# Patient Record
Sex: Female | Born: 1950 | Race: Black or African American | Hispanic: No | Marital: Married | Smoking: Never smoker
Health system: Southern US, Community
[De-identification: ages and names within clinical notes are randomized; demographics above are authoritative.]

## PROBLEM LIST (undated history)

## (undated) DIAGNOSIS — R001 Bradycardia, unspecified: Secondary | ICD-10-CM

## (undated) DIAGNOSIS — Z972 Presence of dental prosthetic device (complete) (partial): Secondary | ICD-10-CM

## (undated) DIAGNOSIS — I38 Endocarditis, valve unspecified: Secondary | ICD-10-CM

## (undated) DIAGNOSIS — E785 Hyperlipidemia, unspecified: Secondary | ICD-10-CM

## (undated) DIAGNOSIS — I10 Essential (primary) hypertension: Secondary | ICD-10-CM

## (undated) HISTORY — PX: TUBAL LIGATION: SHX77

---

## 2004-11-19 ENCOUNTER — Ambulatory Visit: Payer: Self-pay | Admitting: Internal Medicine

## 2004-12-17 ENCOUNTER — Ambulatory Visit: Payer: Self-pay | Admitting: Occupational Therapy

## 2006-01-01 ENCOUNTER — Ambulatory Visit: Payer: Self-pay | Admitting: Nurse Practitioner

## 2007-01-28 ENCOUNTER — Ambulatory Visit: Payer: Self-pay | Admitting: Internal Medicine

## 2008-02-07 ENCOUNTER — Ambulatory Visit: Payer: Self-pay | Admitting: Internal Medicine

## 2008-04-07 ENCOUNTER — Ambulatory Visit: Payer: Self-pay | Admitting: Gastroenterology

## 2009-02-08 ENCOUNTER — Ambulatory Visit: Payer: Self-pay | Admitting: Internal Medicine

## 2010-02-11 ENCOUNTER — Ambulatory Visit: Payer: Self-pay | Admitting: Internal Medicine

## 2011-04-21 ENCOUNTER — Ambulatory Visit: Payer: Self-pay | Admitting: Family Medicine

## 2011-09-19 ENCOUNTER — Ambulatory Visit: Payer: Self-pay | Admitting: Gastroenterology

## 2011-09-22 LAB — PATHOLOGY REPORT

## 2012-05-25 ENCOUNTER — Ambulatory Visit: Payer: Self-pay | Admitting: Internal Medicine

## 2013-09-29 ENCOUNTER — Ambulatory Visit: Payer: Self-pay | Admitting: Family Medicine

## 2014-11-08 ENCOUNTER — Ambulatory Visit: Payer: Self-pay | Admitting: Family Medicine

## 2015-03-05 ENCOUNTER — Ambulatory Visit
Admit: 2015-03-05 | Disposition: A | Discharge status: Home / Self Care | Payer: Self-pay | Attending: Gastroenterology | Admitting: Gastroenterology

## 2015-03-06 LAB — SURGICAL PATHOLOGY

## 2017-01-05 ENCOUNTER — Other Ambulatory Visit: Payer: Self-pay | Admitting: Physician Assistant

## 2017-01-05 DIAGNOSIS — Z1231 Encounter for screening mammogram for malignant neoplasm of breast: Secondary | ICD-10-CM

## 2017-02-09 ENCOUNTER — Encounter: Payer: Self-pay | Admitting: Radiology

## 2017-02-09 ENCOUNTER — Ambulatory Visit
Admission: RE | Admit: 2017-02-09 | Discharge: 2017-02-09 | Disposition: A | Discharge status: Home / Self Care | Payer: BLUE CROSS/BLUE SHIELD | Source: Ambulatory Visit | Attending: Physician Assistant | Admitting: Physician Assistant

## 2017-02-09 DIAGNOSIS — Z1231 Encounter for screening mammogram for malignant neoplasm of breast: Secondary | ICD-10-CM | POA: Diagnosis present

## 2018-01-27 ENCOUNTER — Other Ambulatory Visit: Payer: Self-pay | Admitting: Physician Assistant

## 2018-01-27 DIAGNOSIS — Z780 Asymptomatic menopausal state: Secondary | ICD-10-CM

## 2018-03-01 ENCOUNTER — Other Ambulatory Visit: Payer: BLUE CROSS/BLUE SHIELD

## 2018-03-22 ENCOUNTER — Other Ambulatory Visit: Payer: Self-pay | Admitting: Physician Assistant

## 2018-03-22 DIAGNOSIS — Z1231 Encounter for screening mammogram for malignant neoplasm of breast: Secondary | ICD-10-CM

## 2018-03-23 ENCOUNTER — Other Ambulatory Visit: Payer: Self-pay

## 2018-03-29 ENCOUNTER — Ambulatory Visit
Admission: RE | Admit: 2018-03-29 | Discharge: 2018-03-29 | Disposition: A | Discharge status: Home / Self Care | Payer: Medicare HMO | Source: Ambulatory Visit | Attending: Physician Assistant | Admitting: Physician Assistant

## 2018-03-29 DIAGNOSIS — Z1231 Encounter for screening mammogram for malignant neoplasm of breast: Secondary | ICD-10-CM | POA: Insufficient documentation

## 2018-03-29 DIAGNOSIS — Z780 Asymptomatic menopausal state: Secondary | ICD-10-CM | POA: Insufficient documentation

## 2018-09-10 ENCOUNTER — Encounter: Payer: Self-pay | Admitting: *Deleted

## 2018-09-13 ENCOUNTER — Encounter
Admission: RE | Disposition: A | Discharge status: Home / Self Care | Payer: Self-pay | Source: Ambulatory Visit | Attending: Gastroenterology

## 2018-09-13 ENCOUNTER — Encounter: Payer: Self-pay | Admitting: *Deleted

## 2018-09-13 ENCOUNTER — Ambulatory Visit: Payer: Medicare HMO | Admitting: Anesthesiology

## 2018-09-13 ENCOUNTER — Ambulatory Visit
Admission: RE | Admit: 2018-09-13 | Discharge: 2018-09-13 | Disposition: A | Discharge status: Home / Self Care | Payer: Medicare HMO | Source: Ambulatory Visit | Attending: Gastroenterology | Admitting: Gastroenterology

## 2018-09-13 DIAGNOSIS — Z79899 Other long term (current) drug therapy: Secondary | ICD-10-CM | POA: Diagnosis not present

## 2018-09-13 DIAGNOSIS — E785 Hyperlipidemia, unspecified: Secondary | ICD-10-CM | POA: Insufficient documentation

## 2018-09-13 DIAGNOSIS — D122 Benign neoplasm of ascending colon: Secondary | ICD-10-CM | POA: Diagnosis not present

## 2018-09-13 DIAGNOSIS — K5730 Diverticulosis of large intestine without perforation or abscess without bleeding: Secondary | ICD-10-CM | POA: Insufficient documentation

## 2018-09-13 DIAGNOSIS — I10 Essential (primary) hypertension: Secondary | ICD-10-CM | POA: Insufficient documentation

## 2018-09-13 DIAGNOSIS — Z86010 Personal history of colonic polyps: Secondary | ICD-10-CM | POA: Insufficient documentation

## 2018-09-13 DIAGNOSIS — K621 Rectal polyp: Secondary | ICD-10-CM | POA: Diagnosis not present

## 2018-09-13 DIAGNOSIS — D123 Benign neoplasm of transverse colon: Secondary | ICD-10-CM | POA: Diagnosis not present

## 2018-09-13 DIAGNOSIS — Z8371 Family history of colonic polyps: Secondary | ICD-10-CM | POA: Diagnosis not present

## 2018-09-13 HISTORY — DX: Essential (primary) hypertension: I10

## 2018-09-13 HISTORY — PX: COLONOSCOPY WITH PROPOFOL: SHX5780

## 2018-09-13 HISTORY — DX: Bradycardia, unspecified: R00.1

## 2018-09-13 HISTORY — DX: Hyperlipidemia, unspecified: E78.5

## 2018-09-13 HISTORY — DX: Endocarditis, valve unspecified: I38

## 2018-09-13 SURGERY — COLONOSCOPY WITH PROPOFOL
Anesthesia: General

## 2018-09-13 MED ORDER — FENTANYL CITRATE (PF) 100 MCG/2ML IJ SOLN
INTRAMUSCULAR | Status: AC
  Filled 2018-09-13: qty 2

## 2018-09-13 MED ORDER — MIDAZOLAM HCL 2 MG/2ML IJ SOLN
INTRAMUSCULAR | Status: AC
  Filled 2018-09-13: qty 2

## 2018-09-13 MED ORDER — MIDAZOLAM HCL 2 MG/2ML IJ SOLN
INTRAMUSCULAR | Status: DC | PRN
  Administered 2018-09-13: 2 mg via INTRAVENOUS

## 2018-09-13 MED ORDER — GLYCOPYRROLATE 0.2 MG/ML IJ SOLN
INTRAMUSCULAR | Status: AC
  Filled 2018-09-13: qty 1

## 2018-09-13 MED ORDER — GLYCOPYRROLATE 0.2 MG/ML IJ SOLN
INTRAMUSCULAR | Status: DC | PRN
  Administered 2018-09-13: 0.1 mg via INTRAVENOUS

## 2018-09-13 MED ORDER — FENTANYL CITRATE (PF) 100 MCG/2ML IJ SOLN
INTRAMUSCULAR | Status: DC | PRN
  Administered 2018-09-13: 50 ug via INTRAVENOUS

## 2018-09-13 MED ORDER — PROPOFOL 500 MG/50ML IV EMUL
INTRAVENOUS | Status: DC | PRN
  Administered 2018-09-13: 120 ug/kg/min via INTRAVENOUS

## 2018-09-13 MED ORDER — SODIUM CHLORIDE 0.9 % IV SOLN
INTRAVENOUS | Status: DC
  Administered 2018-09-13 (×2): via INTRAVENOUS

## 2018-09-13 MED ORDER — PROPOFOL 10 MG/ML IV BOLUS
INTRAVENOUS | Status: AC
  Filled 2018-09-13: qty 20

## 2018-09-13 MED ORDER — PROPOFOL 500 MG/50ML IV EMUL
INTRAVENOUS | Status: AC
  Filled 2018-09-13: qty 50

## 2018-09-13 MED ORDER — SODIUM CHLORIDE 0.9 % IV SOLN
INTRAVENOUS | Status: DC
  Administered 2018-09-13: 1000 mL via INTRAVENOUS

## 2018-09-13 MED ORDER — LIDOCAINE HCL (PF) 1 % IJ SOLN: 2.0000 mL | Freq: Once | INTRAMUSCULAR | Status: DC

## 2018-09-13 MED ORDER — EPHEDRINE SULFATE 50 MG/ML IJ SOLN
INTRAMUSCULAR | Status: AC
  Filled 2018-09-13: qty 1

## 2018-09-13 NOTE — H&P (Signed)
Outpatient short stay form Pre-procedure 09/13/2018 12:11 PM Lollie Sails MD  Primary Physician: Delfina Redwood, PA  Reason for visit: Colonoscopy  History of present illness: Patient is a 67 year old female presenting today as above.  She has personal history of adenomatous colon polyps.  Her last colonoscopy was 03/05/2015.  At that time of her 5 adenomas removed.  She also has a family history of colon polyps in a primary relative, sister.  She tolerated her prep well.  She takes no aspirin or blood thinning agent.    Current Facility-Administered Medications:  .  0.9 %  sodium chloride infusion, , Intravenous, Continuous, Lollie Sails, MD .  0.9 %  sodium chloride infusion, , Intravenous, Continuous, Lollie Sails, MD, Last Rate: 20 mL/hr at 09/13/18 1208, 1,000 mL at 09/13/18 1208 .  lidocaine (PF) (XYLOCAINE) 1 % injection 2 mL, 2 mL, Intradermal, Once, Lollie Sails, MD  Medications Prior to Admission  Medication Sig Dispense Refill Last Dose  . calcium carbonate (OSCAL) 1500 (600 Ca) MG TABS tablet Take by mouth 2 (two) times daily with a meal.     . cyanocobalamin 1000 MCG tablet Take 1,000 mcg by mouth daily.     . fluticasone (FLONASE) 50 MCG/ACT nasal spray Place 2 sprays into both nostrils daily.     Marland Kitchen loratadine (CLARITIN) 10 MG tablet Take 10 mg by mouth daily.        No Known Allergies   Past Medical History:  Diagnosis Date  . Bradycardia   . Hyperlipidemia   . Hypertension   . Valvular heart disease     Review of systems:      Physical Exam    Heart and lungs: Without rub or gallop, lungs are bilaterally clear.    HEENT: Normocephalic atraumatic eyes are anicteric    Other:    Pertinant exam for procedure: Soft nontender nondistended bowel sounds positive normoactive.    Planned proceedures: Colonoscopy and indicated procedures I have discussed the risks benefits and complications of procedures to include not limited to bleeding,  infection, perforation and the risk of sedation and the patient wishes to proceed.    Lollie Sails, MD Gastroenterology 09/13/2018  12:11 PM

## 2018-09-13 NOTE — Op Note (Signed)
[facility] Gastroenterology Patient Name: Brittany George Procedure Date: 09/13/2018 12:18 PM MRN: [id number] Account #: [id number] Date of Birth: 07/21/1951 Admit Type: Outpatient Age: 67 Room: [facility] ENDO ROOM 3 Gender: Female Note Status: Finalized Procedure:            Colonoscopy Indications:          Personal history of colonic polyps Providers:            Lollie Sails, MD Referring MD:         No Local Md, MD (Referring MD) Medicines:            Monitored Anesthesia Care Complications:        No immediate complications. Procedure:            Pre-Anesthesia Assessment:                       - ASA Grade Assessment: III - A patient with severe                        systemic disease.                       After obtaining informed consent, the colonoscope was                        passed under direct vision. Throughout the procedure,                        the patient's blood pressure, pulse, and oxygen                        saturations were monitored continuously. The                        Colonoscope was introduced through the anus and                        advanced to the the cecum, identified by appendiceal                        orifice and ileocecal valve. The colonoscopy was                        performed without difficulty. The patient tolerated the                        procedure well. The quality of the bowel preparation                        was good. Findings:      Multiple small-mouthed diverticula were found in the sigmoid colon and       descending colon.      A 3 mm polyp was found in the rectum. The polyp was sessile. The polyp       was removed with a cold biopsy forceps. Resection and retrieval were       complete.      Three sessile polyps were found in the transverse colon. The polyps were       2 to 4 mm in size. These polyps were removed with a cold biopsy forceps.  Resection and retrieval were complete.      A 4 mm  polyp was found in the proximal ascending colon. The polyp was       sessile. The polyp was removed with a cold biopsy forceps. Resection and       retrieval were complete.      A 3 mm polyp was found in the distal transverse colon. The polyp was       sessile. The polyp was removed with a cold biopsy forceps. Resection and       retrieval were complete.      A 5 mm polyp was found in the distal transverse colon. The polyp was       sessile. The polyp was removed with a cold snare. Resection and       retrieval were complete.      The retroflexed view of the distal rectum and anal verge was normal and       showed no anal or rectal abnormalities.      The digital rectal exam was normal. Impression:           - Diverticulosis in the sigmoid colon and in the                        descending colon.                       - One 3 mm polyp in the rectum, removed with a cold                        biopsy forceps. Resected and retrieved.                       - Three 2 to 4 mm polyps in the transverse colon,                        removed with a cold biopsy forceps. Resected and                        retrieved.                       - One 4 mm polyp in the proximal ascending colon,                        removed with a cold biopsy forceps. Resected and                        retrieved.                       - One 3 mm polyp in the distal transverse colon,                        removed with a cold biopsy forceps. Resected and                        retrieved.                       - One 5 mm polyp in the distal transverse colon,  removed with a cold snare. Resected and retrieved.                       - The distal rectum and anal verge are normal on                        retroflexion view. Recommendation:       - Discharge patient to home.                       - Await pathology results.                       - Telephone GI clinic for pathology results in 1  week. Procedure Code(s):    --- Professional ---                       [phone], Colonoscopy, flexible; with removal of tumor(s),                        polyp(s), or other lesion(s) by snare technique                       [id number], 76, Colonoscopy, flexible; with biopsy, single                        or multiple Diagnosis Code(s):    --- Professional ---                       K62.1, Rectal polyp                       D12.2, Benign neoplasm of ascending colon                       D12.3, Benign neoplasm of transverse colon (hepatic                        flexure or splenic flexure)                       Z86.010, Personal history of colonic polyps                       K57.30, Diverticulosis of large intestine without                        perforation or abscess without bleeding CPT copyright 2018 American Medical Association. All rights reserved. The codes documented in this report are preliminary and upon coder review may  be revised to meet current compliance requirements. Lollie Sails, MD 09/13/2018 1:02:12 PM This report has been signed electronically. Number of Addenda: 0 Note Initiated On: 09/13/2018 12:18 PM Scope Withdrawal Time: 0 hours 10 minutes 28 seconds  Total Procedure Duration: 0 hours 34 minutes 32 seconds       [facility]

## 2018-09-13 NOTE — Transfer of Care (Signed)
Immediate Anesthesia Transfer of Care Note  Patient: Brittany George  Procedure(s) Performed: COLONOSCOPY WITH PROPOFOL (N/A )  Patient Location: PACU  Anesthesia Type:General  Level of Consciousness: awake and sedated  Airway & Oxygen Therapy: Patient Spontanous Breathing and Patient connected to nasal cannula oxygen  Post-op Assessment: Report given to RN and Post -op Vital signs reviewed and stable  Post vital signs: Reviewed and stable  Last Vitals:  Vitals Value Taken Time  BP    Temp    Pulse    Resp    SpO2      Last Pain:  Vitals:   09/13/18 1148  TempSrc: Tympanic  PainSc: 0-No pain         Complications: No apparent anesthesia complications

## 2018-09-13 NOTE — Anesthesia Preprocedure Evaluation (Signed)
Anesthesia Evaluation  Patient identified by MRN, date of birth, ID band Patient awake    Reviewed: Allergy & Precautions, H&P , NPO status , Patient's Chart, lab work & pertinent test results  History of Anesthesia Complications Negative for: history of anesthetic complications  Airway Mallampati: III  TM Distance: <3 FB Neck ROM: full    Dental  (+) Chipped, Poor Dentition, Missing   Pulmonary neg pulmonary ROS, neg shortness of breath,           Cardiovascular Exercise Tolerance: Good hypertension, (-) angina(-) Past MI and (-) DOE      Neuro/Psych negative neurological ROS  negative psych ROS   GI/Hepatic negative GI ROS, Neg liver ROS, neg GERD  ,  Endo/Other    Renal/GU negative Renal ROS  negative genitourinary   Musculoskeletal   Abdominal   Peds  Hematology negative hematology ROS (+)   Anesthesia Other Findings Past Medical History: No date: Bradycardia No date: Hyperlipidemia No date: Hypertension No date: Valvular heart disease  Past Surgical History: No date: TUBAL LIGATION     Reproductive/Obstetrics negative OB ROS                             Anesthesia Physical Anesthesia Plan  ASA: III  Anesthesia Plan: General   Post-op Pain Management:    Induction: Intravenous  PONV Risk Score and Plan: Propofol infusion and TIVA  Airway Management Planned: Natural Airway and Nasal Cannula  Additional Equipment:   Intra-op Plan:   Post-operative Plan:   Informed Consent: I have reviewed the patients History and Physical, chart, labs and discussed the procedure including the risks, benefits and alternatives for the proposed anesthesia with the patient or authorized representative who has indicated his/her understanding and acceptance.   Dental Advisory Given  Plan Discussed with: Anesthesiologist, CRNA and Surgeon  Anesthesia Plan Comments: (Patient consented  for risks of anesthesia including but not limited to:  - adverse reactions to medications - risk of intubation if required - damage to teeth, lips or other oral mucosa - sore throat or hoarseness - Damage to heart, brain, lungs or loss of life  Patient voiced understanding.)        Anesthesia Quick Evaluation

## 2018-09-13 NOTE — Anesthesia Procedure Notes (Signed)
Performed by: Cook-Martin, Meridith Romick Pre-anesthesia Checklist: Patient identified, Emergency Drugs available, Suction available, Patient being monitored and Timeout performed Patient Re-evaluated:Patient Re-evaluated prior to induction Oxygen Delivery Method: Nasal cannula Preoxygenation: Pre-oxygenation with 100% oxygen Induction Type: IV induction Placement Confirmation: positive ETCO2 and CO2 detector       

## 2018-09-13 NOTE — Anesthesia Post-op Follow-up Note (Signed)
Anesthesia QCDR form completed.        

## 2018-09-13 NOTE — Anesthesia Postprocedure Evaluation (Signed)
Anesthesia Post Note  Patient: Brittany George  Procedure(s) Performed: COLONOSCOPY WITH PROPOFOL (N/A )  Patient location during evaluation: Endoscopy Anesthesia Type: General Level of consciousness: awake and alert Pain management: pain level controlled Vital Signs Assessment: post-procedure vital signs reviewed and stable Respiratory status: spontaneous breathing, nonlabored ventilation, respiratory function stable and patient connected to nasal cannula oxygen Cardiovascular status: blood pressure returned to baseline and stable Postop Assessment: no apparent nausea or vomiting Anesthetic complications: no     Last Vitals:  Vitals:   09/13/18 1303 09/13/18 1323  BP: 114/72   Pulse: 77   Resp:  18  Temp: (!) 36.1 C   SpO2:      Last Pain:  Vitals:   09/13/18 1323  TempSrc:   PainSc: 0-No pain                 Precious Haws Favor Hackler

## 2018-09-14 ENCOUNTER — Encounter: Payer: Self-pay | Admitting: Gastroenterology

## 2018-09-15 LAB — SURGICAL PATHOLOGY

## 2019-03-17 ENCOUNTER — Other Ambulatory Visit: Payer: Self-pay | Admitting: Physician Assistant

## 2019-03-17 DIAGNOSIS — Z1231 Encounter for screening mammogram for malignant neoplasm of breast: Secondary | ICD-10-CM

## 2019-06-03 ENCOUNTER — Ambulatory Visit
Admission: RE | Admit: 2019-06-03 | Discharge: 2019-06-03 | Disposition: A | Discharge status: Home / Self Care | Payer: Medicare HMO | Source: Ambulatory Visit | Attending: Physician Assistant | Admitting: Physician Assistant

## 2019-06-03 DIAGNOSIS — Z1231 Encounter for screening mammogram for malignant neoplasm of breast: Secondary | ICD-10-CM | POA: Diagnosis not present

## 2020-01-27 ENCOUNTER — Other Ambulatory Visit (HOSPITAL_COMMUNITY): Payer: Self-pay | Admitting: Family Medicine

## 2020-01-27 DIAGNOSIS — M8580 Other specified disorders of bone density and structure, unspecified site: Secondary | ICD-10-CM

## 2020-04-02 ENCOUNTER — Ambulatory Visit (HOSPITAL_COMMUNITY)
Admission: RE | Admit: 2020-04-02 | Discharge: 2020-04-02 | Disposition: A | Discharge status: Home / Self Care | Payer: Medicare HMO | Source: Ambulatory Visit | Attending: Family Medicine | Admitting: Family Medicine

## 2020-04-02 ENCOUNTER — Other Ambulatory Visit: Payer: Self-pay

## 2020-04-02 DIAGNOSIS — M8580 Other specified disorders of bone density and structure, unspecified site: Secondary | ICD-10-CM

## 2020-04-02 DIAGNOSIS — M8588 Other specified disorders of bone density and structure, other site: Secondary | ICD-10-CM | POA: Diagnosis not present

## 2020-04-24 ENCOUNTER — Other Ambulatory Visit: Payer: Self-pay | Admitting: Family Medicine

## 2020-04-24 DIAGNOSIS — Z1231 Encounter for screening mammogram for malignant neoplasm of breast: Secondary | ICD-10-CM

## 2020-06-04 ENCOUNTER — Ambulatory Visit
Admission: RE | Admit: 2020-06-04 | Discharge: 2020-06-04 | Disposition: A | Discharge status: Home / Self Care | Payer: Medicare HMO | Source: Ambulatory Visit | Attending: Family Medicine | Admitting: Family Medicine

## 2020-06-04 DIAGNOSIS — Z1231 Encounter for screening mammogram for malignant neoplasm of breast: Secondary | ICD-10-CM | POA: Insufficient documentation

## 2020-07-31 ENCOUNTER — Ambulatory Visit: Payer: Medicare HMO | Admitting: Dermatology

## 2020-07-31 ENCOUNTER — Other Ambulatory Visit: Payer: Self-pay

## 2020-07-31 DIAGNOSIS — D229 Melanocytic nevi, unspecified: Secondary | ICD-10-CM

## 2020-07-31 DIAGNOSIS — D220 Melanocytic nevi of lip: Secondary | ICD-10-CM

## 2020-07-31 DIAGNOSIS — L304 Erythema intertrigo: Secondary | ICD-10-CM

## 2020-07-31 MED ORDER — HYDROCORTISONE 2.5 % EX CREA: TOPICAL_CREAM | CUTANEOUS | 2 refills | Status: DC

## 2020-07-31 MED ORDER — KETOCONAZOLE 2 % EX CREA: TOPICAL_CREAM | CUTANEOUS | 2 refills | Status: DC

## 2020-07-31 NOTE — Progress Notes (Signed)
   Follow-Up Visit   Subjective  Brittany George is a 69 y.o. female who presents for the following: Follow-up.  Patient here today for follow up. She was seen over a year ago so we have no record of her last visit. We saw her for a spot at her top lip that dentist recommend she have evaluated. As far as patient can tell there has been no change.  She was also seen for a rash around the neck that we gave her a prescription cream for but unsure of name. She does have some bumps now inframammary area she would like checked. They do itch.   The following portions of the chart were reviewed this encounter and updated as appropriate:      Review of Systems:  No other skin or systemic complaints except as noted in HPI or Assessment and Plan.  Objective  Well appearing patient in no apparent distress; mood and affect are within normal limits.  A focused examination was performed including face, chest, neck. Relevant physical exam findings are noted in the Assessment and Plan.  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  Stable. Observation.  Call clinic for new or changing moles.     Erythema intertrigo inframammary  Resolving with PIH, with continued pruritus  Recommend Zeasorb AF powder Start hydrocortisone 2.5% cream 1-2 times daily as needed for itch. Start ketoconazole 2% cream daily to areas under breast.  Ordered Medications: ketoconazole (NIZORAL) 2 % cream hydrocortisone 2.5 % cream  Return in about 8 months (around 04/15/2021).  Graciella Belton, RMA, am acting as scribe for Brendolyn Patty, MD . Documentation: I have reviewed the above documentation for accuracy and completeness, and I agree with the above.  Brendolyn Patty MD

## 2020-07-31 NOTE — Patient Instructions (Addendum)
Recommend Zeasorb AF powder to areas for rash under breast. Start hydrocortisone 2.5% cream 1-2 times daily as needed for itch under breast. Start ketoconazole 2% cream daily to areas under breast as needed for rash.

## 2020-08-01 ENCOUNTER — Other Ambulatory Visit: Payer: Self-pay

## 2020-08-01 DIAGNOSIS — L304 Erythema intertrigo: Secondary | ICD-10-CM

## 2020-08-01 MED ORDER — KETOCONAZOLE 2 % EX CREA: TOPICAL_CREAM | CUTANEOUS | 2 refills | Status: DC

## 2020-08-01 MED ORDER — HYDROCORTISONE 2.5 % EX CREA: TOPICAL_CREAM | CUTANEOUS | 2 refills | Status: DC

## 2020-08-02 ENCOUNTER — Other Ambulatory Visit: Payer: Self-pay

## 2020-08-02 DIAGNOSIS — L304 Erythema intertrigo: Secondary | ICD-10-CM

## 2020-08-06 MED ORDER — HYDROCORTISONE 2.5 % EX CREA: TOPICAL_CREAM | CUTANEOUS | 2 refills | Status: DC

## 2020-08-06 MED ORDER — KETOCONAZOLE 2 % EX CREA: TOPICAL_CREAM | CUTANEOUS | 2 refills | Status: DC

## 2020-08-06 MED ORDER — KETOCONAZOLE 2 % EX CREA: TOPICAL_CREAM | CUTANEOUS | 2 refills | Status: AC

## 2020-08-06 MED ORDER — HYDROCORTISONE 2.5 % EX CREA: TOPICAL_CREAM | CUTANEOUS | 2 refills | Status: AC

## 2020-08-06 NOTE — Progress Notes (Signed)
Per pt, pharmacy did not receive original prescriptions.

## 2020-08-06 NOTE — Addendum Note (Signed)
Addended by: Harriett Sine on: 08/06/2020 12:50 PM   Modules accepted: Orders

## 2021-03-04 ENCOUNTER — Other Ambulatory Visit: Payer: Self-pay | Admitting: Family Medicine

## 2021-03-04 DIAGNOSIS — Z1231 Encounter for screening mammogram for malignant neoplasm of breast: Secondary | ICD-10-CM

## 2021-03-08 ENCOUNTER — Other Ambulatory Visit: Payer: Self-pay | Admitting: Family Medicine

## 2021-03-08 DIAGNOSIS — Z1231 Encounter for screening mammogram for malignant neoplasm of breast: Secondary | ICD-10-CM

## 2021-03-25 ENCOUNTER — Other Ambulatory Visit: Payer: Self-pay | Admitting: Family Medicine

## 2021-03-25 DIAGNOSIS — M8580 Other specified disorders of bone density and structure, unspecified site: Secondary | ICD-10-CM

## 2021-04-16 ENCOUNTER — Other Ambulatory Visit: Payer: Self-pay

## 2021-04-16 ENCOUNTER — Ambulatory Visit: Payer: Medicare HMO | Admitting: Dermatology

## 2021-04-16 DIAGNOSIS — D229 Melanocytic nevi, unspecified: Secondary | ICD-10-CM

## 2021-04-16 DIAGNOSIS — S20462A Insect bite (nonvenomous) of left back wall of thorax, initial encounter: Secondary | ICD-10-CM

## 2021-04-16 DIAGNOSIS — L304 Erythema intertrigo: Secondary | ICD-10-CM

## 2021-04-16 DIAGNOSIS — S20469A Insect bite (nonvenomous) of unspecified back wall of thorax, initial encounter: Secondary | ICD-10-CM

## 2021-04-16 DIAGNOSIS — D220 Melanocytic nevi of lip: Secondary | ICD-10-CM

## 2021-04-16 DIAGNOSIS — W57XXXA Bitten or stung by nonvenomous insect and other nonvenomous arthropods, initial encounter: Secondary | ICD-10-CM

## 2021-04-16 NOTE — Patient Instructions (Signed)
Zeasorb Af powder - over the counter - can be used daily as needed to areas under breast for rash   Halog ointment - apply tiny amount to affected area at left upper back twice daily as needed for itch.        If you have any questions or concerns for your doctor, please call our main line at [phone] and press option 4 to reach your doctor's medical assistant. If no one answers, please leave a voicemail as directed and we will return your call as soon as possible. Messages left after 4 pm will be answered the following business day.   You may also send [place] a message via [place]. We typically respond to MyChart messages within 1-2 business days.  For prescription refills, please ask your pharmacy to contact our office. Our fax number is [phone].  If you have an urgent issue when the clinic is closed that cannot wait until the next business day, you can page your doctor at the number below.    Please note that while we do our best to be available for urgent issues outside of office hours, we are not available 24/7.   If you have an urgent issue and are unable to reach [place], you may choose to seek medical care at your doctor's office, retail clinic, urgent care center, or emergency room.  If you have a medical emergency, please immediately call 911 or go to the emergency department.  Pager Numbers  - Dr. Nehemiah Massed: [phone]  - Dr. Laurence Ferrari: [phone]  - Dr. Nicole Kindred: [phone]  In the event of inclement weather, please call our main line at [phone] for an update on the status of any delays or closures.  Dermatology Medication Tips: Please keep the boxes that topical medications come in in order to help keep track of the instructions about where and how to use these. Pharmacies typically print the medication instructions only on the boxes and not directly on the medication tubes.   If your medication is too expensive, please contact our office at [phone] option 4  or send [place] a message through [place].   We are unable to tell what your co-pay for medications will be in advance as this is different depending on your insurance coverage. However, we may be able to find a substitute medication at lower cost or fill out paperwork to get insurance to cover a needed medication.   If a prior authorization is required to get your medication covered by your insurance company, please allow [place] 1-2 business days to complete this process.  Drug prices often vary depending on where the prescription is filled and some pharmacies may offer cheaper prices.  The website www.goodrx.com contains coupons for medications through different pharmacies. The prices here do not account for what the cost may be with help from insurance (it may be cheaper with your insurance), but the website can give you the price if you did not use any insurance.  - You can print the associated coupon and take it with your prescription to the pharmacy.  - You may also stop by our office during regular business hours and pick up a GoodRx coupon card.  - If you need your prescription sent electronically to a different pharmacy, notify our office through [facility] or by phone at [phone] option 4.

## 2021-04-16 NOTE — Progress Notes (Signed)
   Follow-Up Visit   Subjective  Brittany George is a 70 y.o. female who presents for the following: 8 month follow up (Patient here today for follow up on nevus at upper lip and intertrigo under breast. She reports rash under breast is doing better. She is still continuing to use ketoconazle 2 % cream and hydrocortisone 2.5. She also states she was bit by a tick a week ago and would like area on left side back checked today. She states her niece removed a piece of tick. Since then area is still itchy. ).  The following portions of the chart were reviewed this encounter and updated as appropriate:       Objective  Well appearing patient in no apparent distress; mood and affect are within normal limits.  A focused examination was performed including lip, left upper back, inframammary folds . Relevant physical exam findings are noted in the Assessment and Plan.  Objective  Right Inframammary Fold: Hyperpigmentation at BL inframammary fold, no active rash today   Objective  Mucosal  Upper Lip: 1.59mm dark gray blue papule, No change when compared to photo  Objective  Left Upper Back: Pink brown erythematous papule at left upper back- itchy per pt  Assessment & Plan  Erythema intertrigo Right Inframammary Fold  Controlled   Intertrigo is a chronic recurrent rash that occurs in skin fold areas that may be associated with friction; heat; moisture; yeast; fungus; and bacteria.  It is exacerbated by increased movement / activity; sweating; and higher atmospheric temperature.  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  Stable. Observation.  Call clinic for  new or changing moles.    Arthropod bite of back, initial encounter Left Upper Back  Patient states tick removed from area a week ago.  Start Halog ointment - apply tiny amount to affect area of left upper back twice daily as needed for itch   Sample given for Halog ointment exp 12/2021  lot # [id number]     Return in about 1 year (around 04/16/2022) for nevus, intertrigo follow up.   I, Ruthell Rummage, CMA, am acting as scribe for Brendolyn Patty, MD.  Documentation: I have reviewed the above documentation for accuracy and completeness, and I agree with the above.  Brendolyn Patty MD

## 2021-06-05 ENCOUNTER — Ambulatory Visit
Admission: RE | Admit: 2021-06-05 | Discharge: 2021-06-05 | Disposition: A | Discharge status: Home / Self Care | Payer: Medicare HMO | Source: Ambulatory Visit | Attending: Family Medicine | Admitting: Family Medicine

## 2021-06-05 ENCOUNTER — Other Ambulatory Visit: Payer: Self-pay

## 2021-06-05 DIAGNOSIS — Z1231 Encounter for screening mammogram for malignant neoplasm of breast: Secondary | ICD-10-CM | POA: Diagnosis not present

## 2022-01-27 ENCOUNTER — Ambulatory Visit: Payer: Medicare HMO | Admitting: Anesthesiology

## 2022-01-27 ENCOUNTER — Ambulatory Visit
Admission: RE | Admit: 2022-01-27 | Discharge: 2022-01-27 | Disposition: A | Discharge status: Home / Self Care | Payer: Medicare HMO | Attending: Gastroenterology | Admitting: Gastroenterology

## 2022-01-27 ENCOUNTER — Encounter
Admission: RE | Disposition: A | Discharge status: Home / Self Care | Payer: Self-pay | Source: Home / Self Care | Attending: Gastroenterology

## 2022-01-27 DIAGNOSIS — D120 Benign neoplasm of cecum: Secondary | ICD-10-CM | POA: Diagnosis not present

## 2022-01-27 DIAGNOSIS — E669 Obesity, unspecified: Secondary | ICD-10-CM | POA: Insufficient documentation

## 2022-01-27 DIAGNOSIS — K5730 Diverticulosis of large intestine without perforation or abscess without bleeding: Secondary | ICD-10-CM | POA: Insufficient documentation

## 2022-01-27 DIAGNOSIS — K640 First degree hemorrhoids: Secondary | ICD-10-CM | POA: Insufficient documentation

## 2022-01-27 DIAGNOSIS — E785 Hyperlipidemia, unspecified: Secondary | ICD-10-CM | POA: Diagnosis not present

## 2022-01-27 DIAGNOSIS — D123 Benign neoplasm of transverse colon: Secondary | ICD-10-CM | POA: Insufficient documentation

## 2022-01-27 DIAGNOSIS — I10 Essential (primary) hypertension: Secondary | ICD-10-CM | POA: Insufficient documentation

## 2022-01-27 DIAGNOSIS — Z1211 Encounter for screening for malignant neoplasm of colon: Secondary | ICD-10-CM | POA: Diagnosis present

## 2022-01-27 DIAGNOSIS — Z6837 Body mass index (BMI) 37.0-37.9, adult: Secondary | ICD-10-CM | POA: Insufficient documentation

## 2022-01-27 HISTORY — PX: COLONOSCOPY WITH PROPOFOL: SHX5780

## 2022-01-27 SURGERY — COLONOSCOPY WITH PROPOFOL
Anesthesia: General

## 2022-01-27 MED ORDER — PROPOFOL 500 MG/50ML IV EMUL
INTRAVENOUS | Status: DC | PRN
  Administered 2022-01-27: 160 ug/kg/min via INTRAVENOUS

## 2022-01-27 MED ORDER — SODIUM CHLORIDE 0.9 % IV SOLN
INTRAVENOUS | Status: DC
  Administered 2022-01-27: 20 mL/h via INTRAVENOUS

## 2022-01-27 MED ORDER — PROPOFOL 10 MG/ML IV BOLUS
INTRAVENOUS | Status: DC | PRN
  Administered 2022-01-27: 70 mg via INTRAVENOUS

## 2022-01-27 MED ORDER — PHENYLEPHRINE HCL (PRESSORS) 10 MG/ML IV SOLN
INTRAVENOUS | Status: DC | PRN
Start: 2022-01-27 — End: 2022-01-27
  Administered 2022-01-27 (×3): 40 ug via INTRAVENOUS

## 2022-01-27 MED ORDER — PROPOFOL 500 MG/50ML IV EMUL
INTRAVENOUS | Status: AC
  Filled 2022-01-27: qty 50

## 2022-01-27 NOTE — Anesthesia Procedure Notes (Signed)
Date/Time: 01/27/2022 11:49 AM ?Performed by: Demetrius Charity, CRNA ?Pre-anesthesia Checklist: Patient identified, Emergency Drugs available, Suction available, Patient being monitored and Timeout performed ?Patient Re-evaluated:Patient Re-evaluated prior to induction ?Oxygen Delivery Method: Nasal cannula ?Induction Type: IV induction ?Placement Confirmation: CO2 detector and positive ETCO2 ? ? ? ? ?

## 2022-01-27 NOTE — Anesthesia Preprocedure Evaluation (Signed)
Anesthesia Evaluation  ?Patient identified by MRN, date of birth, ID band ?Patient awake ? ? ? ?Reviewed: ?Allergy & Precautions, NPO status , Patient's Chart, lab work & pertinent test results ? ?History of Anesthesia Complications ?Negative for: history of anesthetic complications ? ?Airway ?Mallampati: III ? ?TM Distance: >3 FB ?Neck ROM: full ? ? ? Dental ? ?(+) Missing ?  ?Pulmonary ?neg pulmonary ROS, neg shortness of breath,  ?  ?Pulmonary exam normal ? ? ? ? ? ? ? Cardiovascular ?Exercise Tolerance: Good ?hypertension, Normal cardiovascular exam ? ? ?  ?Neuro/Psych ?negative neurological ROS ? negative psych ROS  ? GI/Hepatic ?negative GI ROS, Neg liver ROS, neg GERD  ,  ?Endo/Other  ?Morbid obesity ? Renal/GU ?negative Renal ROS  ?negative genitourinary ?  ?Musculoskeletal ? ? Abdominal ?  ?Peds ? Hematology ?negative hematology ROS ?(+)   ?Anesthesia Other Findings ?Past Medical History: ?No date: Bradycardia ?No date: Hyperlipidemia ?No date: Hypertension ?No date: Valvular heart disease ? ?Past Surgical History: ?09/13/2018: COLONOSCOPY WITH PROPOFOL; N/A ?    Comment:  Procedure: COLONOSCOPY WITH PROPOFOL;  Surgeon:  ?             Lollie Sails, MD;  Location: [facility] ENDOSCOPY;   ?             Service: Endoscopy;  Laterality: N/A; ?No date: TUBAL LIGATION ? ?BMI   ? Body Mass Index: 37.43 kg/m?  ?  ? ? Reproductive/Obstetrics ?negative OB ROS ? ?  ? ? ? ? ? ? ? ? ? ? ? ? ? ?  ?  ? ? ? ? ? ? ? ? ?Anesthesia Physical ?Anesthesia Plan ? ?ASA: 3 ? ?Anesthesia Plan: General  ? ?Post-op Pain Management:   ? ?Induction: Intravenous ? ?PONV Risk Score and Plan: Propofol infusion and TIVA ? ?Airway Management Planned: Natural Airway and Nasal Cannula ? ?Additional Equipment:  ? ?Intra-op Plan:  ? ?Post-operative Plan:  ? ?Informed Consent: I have reviewed the patients History and Physical, chart, labs and discussed the procedure including the risks, benefits and alternatives  for the proposed anesthesia with the patient or authorized representative who has indicated his/her understanding and acceptance.  ? ? ? ?Dental Advisory Given ? ?Plan Discussed with: Anesthesiologist, CRNA and Surgeon ? ?Anesthesia Plan Comments: (Patient consented for risks of anesthesia including but not limited to:  ?- adverse reactions to medications ?- risk of airway placement if required ?- damage to eyes, teeth, lips or other oral mucosa ?- nerve damage due to positioning  ?- sore throat or hoarseness ?- Damage to heart, brain, nerves, lungs, other parts of body or loss of life ? ?Patient voiced understanding.)  ? ? ? ? ? ? ?Anesthesia Quick Evaluation ? ?

## 2022-01-27 NOTE — Transfer of Care (Signed)
Immediate Anesthesia Transfer of Care Note ? ?Patient: Brittany George ? ?Procedure(s) Performed: COLONOSCOPY WITH PROPOFOL ? ?Patient Location: PACU ? ?Anesthesia Type:General ? ?Level of Consciousness: drowsy ? ?Airway & Oxygen Therapy: Patient Spontanous Breathing and Patient connected to nasal cannula oxygen ? ?Post-op Assessment: Report given to RN and Post -op Vital signs reviewed and stable ? ?Post vital signs: Reviewed and stable ? ?Last Vitals:  ?Vitals Value Taken Time  ?BP    ?Temp    ?Pulse    ?Resp    ?SpO2    ? ? ?Last Pain:  ?Vitals:  ? 01/27/22 1104  ?TempSrc: Temporal  ?PainSc: 0-No pain  ?   ? ?  ? ?Complications: No notable events documented. ?

## 2022-01-27 NOTE — Anesthesia Postprocedure Evaluation (Signed)
Anesthesia Post Note ? ?Patient: Brittany George ? ?Procedure(s) Performed: COLONOSCOPY WITH PROPOFOL ? ?Patient location during evaluation: Endoscopy ?Anesthesia Type: General ?Level of consciousness: awake and alert ?Pain management: pain level controlled ?Vital Signs Assessment: post-procedure vital signs reviewed and stable ?Respiratory status: spontaneous breathing, nonlabored ventilation, respiratory function stable and patient connected to nasal cannula oxygen ?Cardiovascular status: blood pressure returned to baseline and stable ?Postop Assessment: no apparent nausea or vomiting ?Anesthetic complications: no ? ? ?No notable events documented. ? ? ?Last Vitals:  ?Vitals:  ? 01/27/22 1229 01/27/22 1239  ?BP: 135/67 125/81  ?Pulse: (!) 52 (!) 45  ?Resp: 19 13  ?Temp:    ?SpO2: 100% 100%  ?  ?Last Pain:  ?Vitals:  ? 01/27/22 1239  ?TempSrc:   ?PainSc: 0-No pain  ? ? ?  ?  ?  ?  ?  ?  ? ?Brittany George ? ? ? ? ?

## 2022-01-27 NOTE — Interval H&P Note (Signed)
History and Physical Interval Note: ? ?01/27/2022 ?11:45 AM ? ?Brittany George  has presented today for surgery, with the diagnosis of PH Colonic Polyps.  The various methods of treatment have been discussed with the patient and family. After consideration of risks, benefits and other options for treatment, the patient has consented to  Procedure(s): ?COLONOSCOPY WITH PROPOFOL (N/A) as a surgical intervention.  The patient's history has been reviewed, patient examined, no change in status, stable for surgery.  I have reviewed the patient's chart and labs.  Questions were answered to the patient's satisfaction.   ? ? ?Hilton Cork [name] ? ?Ok to proceed with colonoscopy ?

## 2022-01-27 NOTE — H&P (Signed)
Outpatient short stay form Pre-procedure ?01/27/2022  ?Lesly Rubenstein, MD ? ?Primary Physician: [facility] [employer] ? ?Reason for visit:  Surveillance Colonoscopy ? ?History of present illness:   ? ?71 y/o lady with history of multiple polyps on last colonoscopy in 2019 here for surveillance colonoscopy. No blood thinners. No family history of GI malignancies. No significant abdominal surgeries. History of obesity and HLD. ? ? ? ?Current Facility-Administered Medications:  ?  0.9 %  sodium chloride infusion, , Intravenous, Continuous, Miakoda Mcmillion, Hilton Cork, MD, Last Rate: 20 mL/hr at 01/27/22 1133, Continued from Pre-op at 01/27/22 1133 ? ?Medications Prior to Admission  ?Medication Sig Dispense Refill Last Dose  ? fluticasone (FLONASE) 50 MCG/ACT nasal spray Place 2 sprays into both nostrils daily.   Past Week  ? hydrocortisone 2.5 % cream Apply as needed for itch 1-2 times daily to area under breast 30 g 2 Past Week  ? ketoconazole (NIZORAL) 2 % cream Apply to areas under breast as needed for rash 60 g 2 Past Week  ? loratadine (CLARITIN) 10 MG tablet Take 10 mg by mouth daily.   Past Week  ? mometasone (ELOCON) 0.1 % cream SMARTSIG:Sparingly Topical Twice Daily   Past Week  ? pravastatin (PRAVACHOL) 40 MG tablet Take 40 mg by mouth daily.   Past Week  ? ? ? ?No Known Allergies ? ? ?Past Medical History:  ?Diagnosis Date  ? Bradycardia   ? Hyperlipidemia   ? Hypertension   ? Valvular heart disease   ? ? ?Review of systems:  Otherwise negative.  ? ? ?Physical Exam ? ?Gen: Alert, oriented. Appears stated age.  ?HEENT: PERRLA. ?Lungs: No respiratory distress ?CV: RRR ?Abd: soft, benign, no masses ?Ext: No edema ? ? ? ?Planned procedures: Proceed with colonoscopy. The patient understands the nature of the planned procedure, indications, risks, alternatives and potential complications including but not limited to bleeding, infection, perforation, damage to internal organs and possible oversedation/side  effects from anesthesia. The patient agrees and gives consent to proceed.  ?Please refer to procedure notes for findings, recommendations and patient disposition/instructions.  ? ? ? ?Lesly Rubenstein, MD ?[facility] ? ? ? ?  ? ?

## 2022-01-27 NOTE — Op Note (Signed)
[facility] ?Gastroenterology ?Patient Name: Brittany George ?Procedure Date: 01/27/2022 11:45 AM ?MRN: [id number] ?Account #: [id number] ?Date of Birth: Nov 07, 1951 ?Admit Type: Outpatient ?Age: 71 ?Room: [facility] ENDO ROOM 3 ?Gender: Female ?Note Status: Finalized ?Instrument Name: Colonoscope [id number] ?Procedure:             Colonoscopy ?Indications:           Surveillance: Personal history of adenomatous polyps  ?                       on last colonoscopy > 3 years ago ?Providers:             Andrey Farmer MD, MD ?Medicines:             Monitored Anesthesia Care ?Complications:         No immediate complications. Estimated blood loss:  ?                       Minimal. ?Procedure:             Pre-Anesthesia Assessment: ?                       - Prior to the procedure, a History and Physical was  ?                       performed, and patient medications and allergies were  ?                       reviewed. The patient is competent. The risks and  ?                       benefits of the procedure and the sedation options and  ?                       risks were discussed with the patient. All questions  ?                       were answered and informed consent was obtained.  ?                       Patient identification and proposed procedure were  ?                       verified by the physician, the nurse, the  ?                       anesthesiologist, the anesthetist and the technician  ?                       in the endoscopy suite. Mental Status Examination:  ?                       alert and oriented. Airway Examination: normal  ?                       oropharyngeal airway and neck mobility. Respiratory  ?                       Examination: clear to auscultation. CV Examination:  ?  normal. Prophylactic Antibiotics: The patient does not  ?                       require prophylactic antibiotics. Prior  ?                       Anticoagulants: The patient has taken no previous   ?                       anticoagulant or antiplatelet agents. ASA Grade  ?                       Assessment: III - A patient with severe systemic  ?                       disease. After reviewing the risks and benefits, the  ?                       patient was deemed in satisfactory condition to  ?                       undergo the procedure. The anesthesia plan was to use  ?                       monitored anesthesia care (MAC). Immediately prior to  ?                       administration of medications, the patient was  ?                       re-assessed for adequacy to receive sedatives. The  ?                       heart rate, respiratory rate, oxygen saturations,  ?                       blood pressure, adequacy of pulmonary ventilation, and  ?                       response to care were monitored throughout the  ?                       procedure. The physical status of the patient was  ?                       re-assessed after the procedure. ?                       After obtaining informed consent, the colonoscope was  ?                       passed under direct vision. Throughout the procedure,  ?                       the patient's blood pressure, pulse, and oxygen  ?                       saturations were monitored continuously. The  ?  Colonoscope was introduced through the anus and  ?                       advanced to the the cecum, identified by appendiceal  ?                       orifice and ileocecal valve. The colonoscopy was  ?                       performed without difficulty. The patient tolerated  ?                       the procedure well. The quality of the bowel  ?                       preparation was adequate to identify polyps. ?Findings: ?     The perianal and digital rectal examinations were normal. ?     Three sessile polyps were found in the cecum. The polyps were 2 to 3 mm  ?     in size. These polyps were removed with a cold snare. Resection and  ?      retrieval were complete. Estimated blood loss was minimal. ?     A 2 mm polyp was found in the hepatic flexure. The polyp was sessile.  ?     The polyp was removed with a jumbo cold forceps. Resection and retrieval  ?     were complete. Estimated blood loss was minimal. ?     A 1 mm polyp was found in the transverse colon. The polyp was sessile.  ?     The polyp was removed with a jumbo cold forceps. Resection and retrieval  ?     were complete. Estimated blood loss was minimal. ?     A 3 mm polyp was found in the transverse colon. The polyp was sessile.  ?     The polyp was removed with a cold snare. Resection and retrieval were  ?     complete. Estimated blood loss was minimal. ?     Multiple small-mouthed diverticula were found in the sigmoid colon. ?     Internal hemorrhoids were found during retroflexion. The hemorrhoids  ?     were Grade I (internal hemorrhoids that do not prolapse). ?Impression:            - Three 2 to 3 mm polyps in the cecum, removed with a  ?                       cold snare. Resected and retrieved. ?                       - One 2 mm polyp at the hepatic flexure, removed with  ?                       a jumbo cold forceps. Resected and retrieved. ?                       - One 1 mm polyp in the transverse colon, removed with  ?                       a jumbo cold  forceps. Resected and retrieved. ?                       - One 3 mm polyp in the transverse colon, removed with  ?                       a cold snare. Resected and retrieved. ?                       - Diverticulosis in the sigmoid colon. ?                       - Internal hemorrhoids. ?Recommendation:        - Repeat colonoscopy in 3 years for surveillance. ?                       - Return to referring physician as previously  ?                       scheduled. ?Procedure Code(s):     --- Professional --- ?                       [phone], Colonoscopy, flexible; with removal of  ?                       tumor(s), polyp(s), or other  lesion(s) by snare  ?                       technique ?                       [id number], 59, Colonoscopy, flexible; with biopsy, single  ?                       or multiple ?Diagnosis Code(s):     --- Professional --- ?                       K63.5, Polyp of colon ?                       Z86.010, Personal history of colonic polyps ?                       K64.0, First degree hemorrhoids ?                       K57.30, Diverticulosis of large intestine without  ?                       perforation or abscess without bleeding ?CPT copyright 2019 American Medical Association. All rights reserved. ?The codes documented in this report are preliminary and upon coder review may  ?be revised to meet current compliance requirements. ?Andrey Farmer MD, MD ?01/27/2022 12:22:08 PM ?Number of Addenda: 0 ?Note Initiated On: 01/27/2022 11:45 AM ?Scope Withdrawal Time: 0 hours 12 minutes 6 seconds  ?Total Procedure Duration: 0 hours 21 minutes 54 seconds  ?Estimated Blood Loss:  Estimated blood loss was minimal. ?     [facility] ?

## 2022-01-28 ENCOUNTER — Encounter: Payer: Self-pay | Admitting: Gastroenterology

## 2022-01-28 LAB — SURGICAL PATHOLOGY

## 2022-02-12 ENCOUNTER — Other Ambulatory Visit: Payer: Self-pay | Admitting: Student

## 2022-02-12 ENCOUNTER — Other Ambulatory Visit: Payer: Self-pay | Admitting: Radiology

## 2022-02-12 DIAGNOSIS — Z1231 Encounter for screening mammogram for malignant neoplasm of breast: Secondary | ICD-10-CM

## 2022-03-27 IMAGING — MG MM DIGITAL SCREENING BILAT W/ TOMO AND CAD
6 of 10 series · 6 of 30 positions shown · non-contrast
Comparison: Previous exam(s).

CLINICAL DATA: Screening.

EXAM:
DIGITAL SCREENING BILATERAL MAMMOGRAM WITH TOMOSYNTHESIS AND CAD
TECHNIQUE: Bilateral screening digital craniocaudal and mediolateral oblique
mammograms were obtained. Bilateral screening digital breast
tomosynthesis was performed. The images were evaluated with
computer-aided detection.

[R MLO synth-2D]
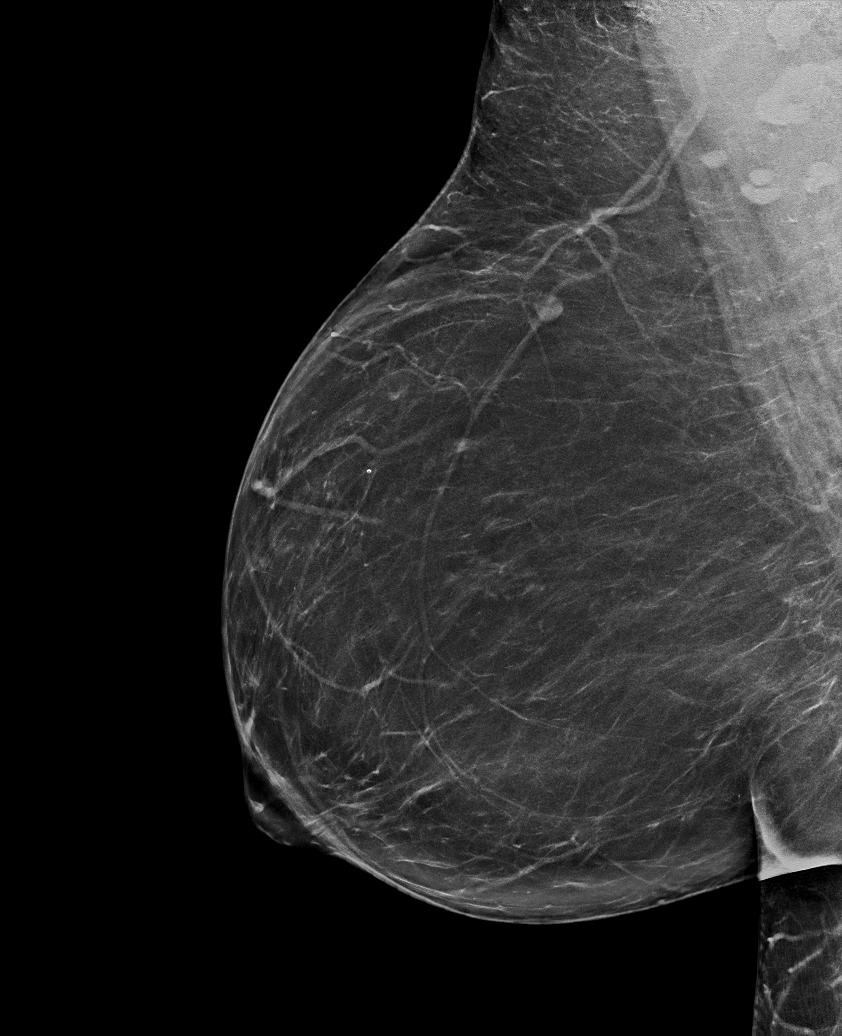

[L CC synth-2D]
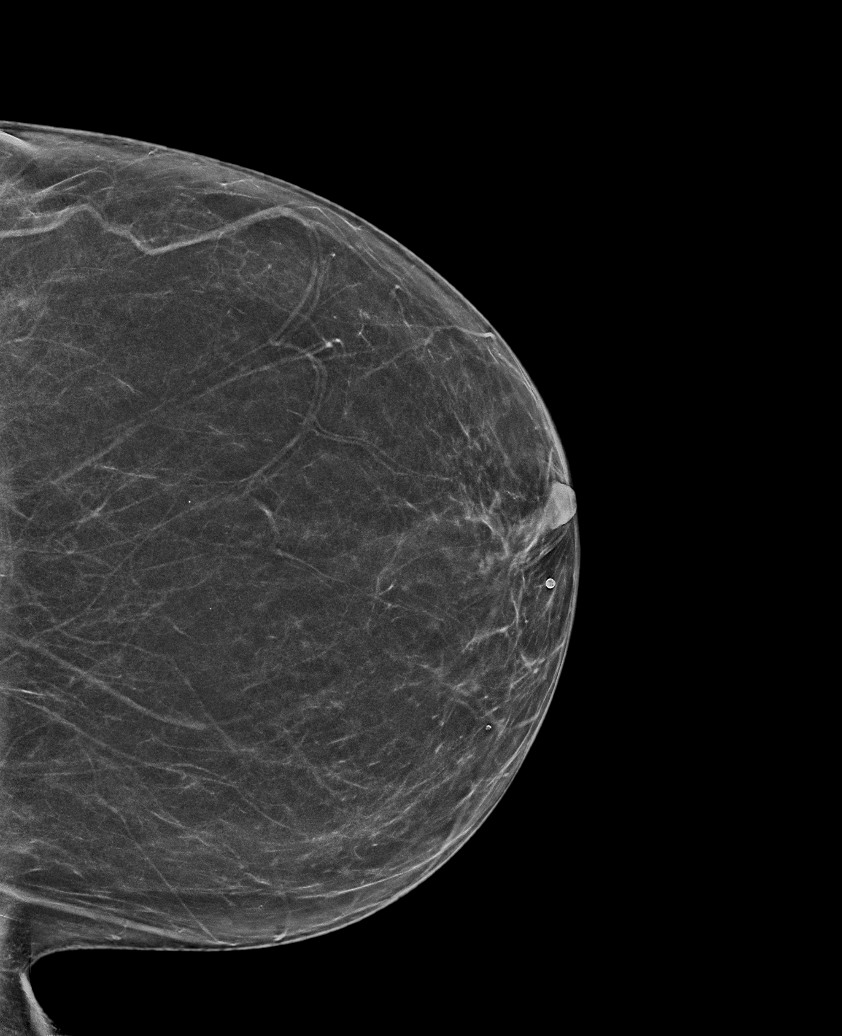

[L MLO synth-2D (1 of 2)]
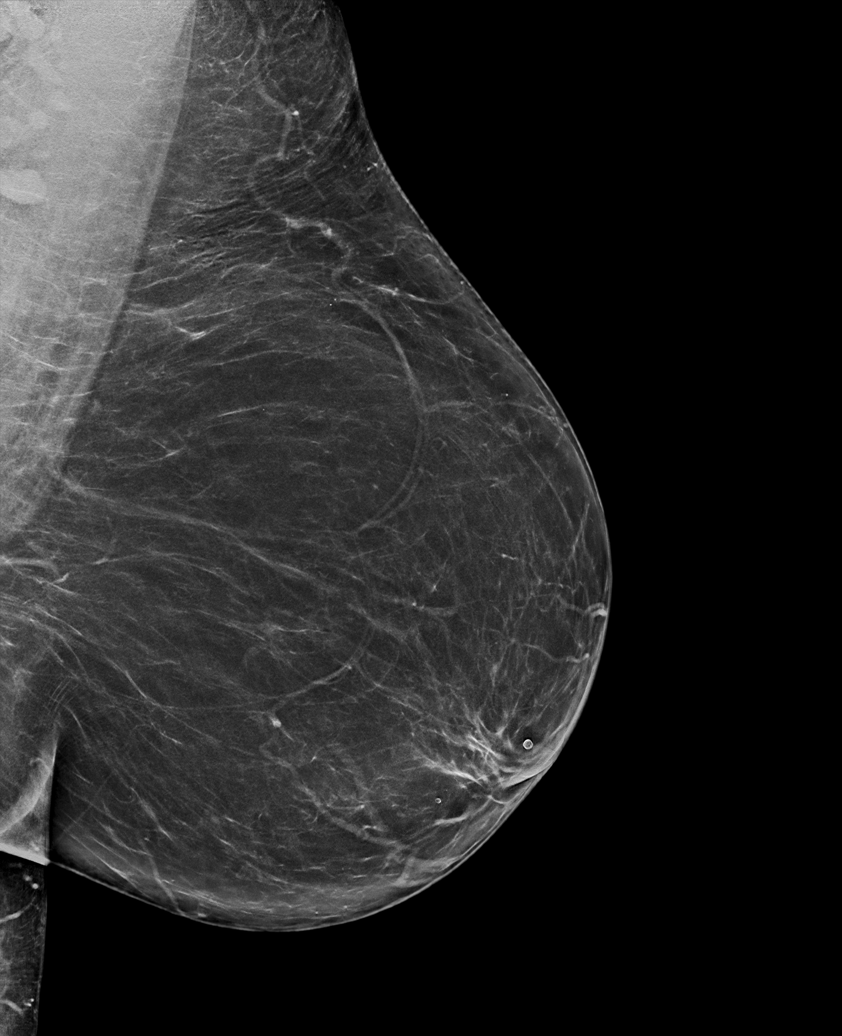

[R CC synth-2D]
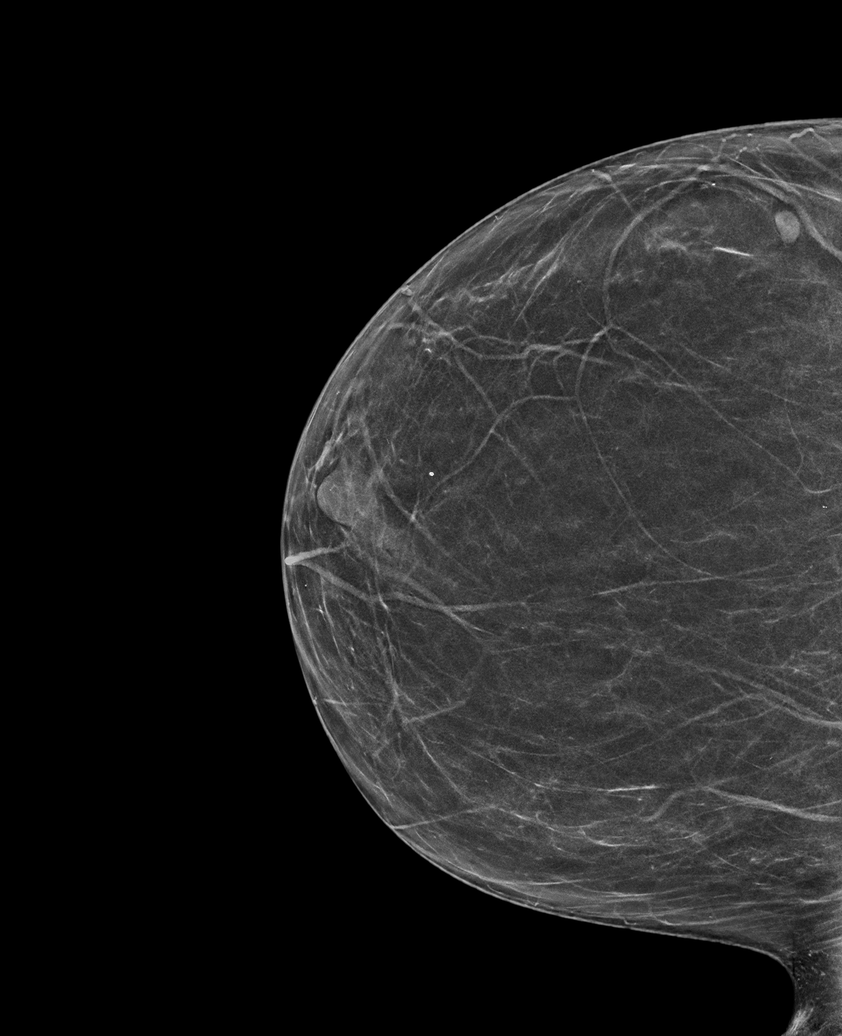

[L MLO synth-2D (2 of 2)]
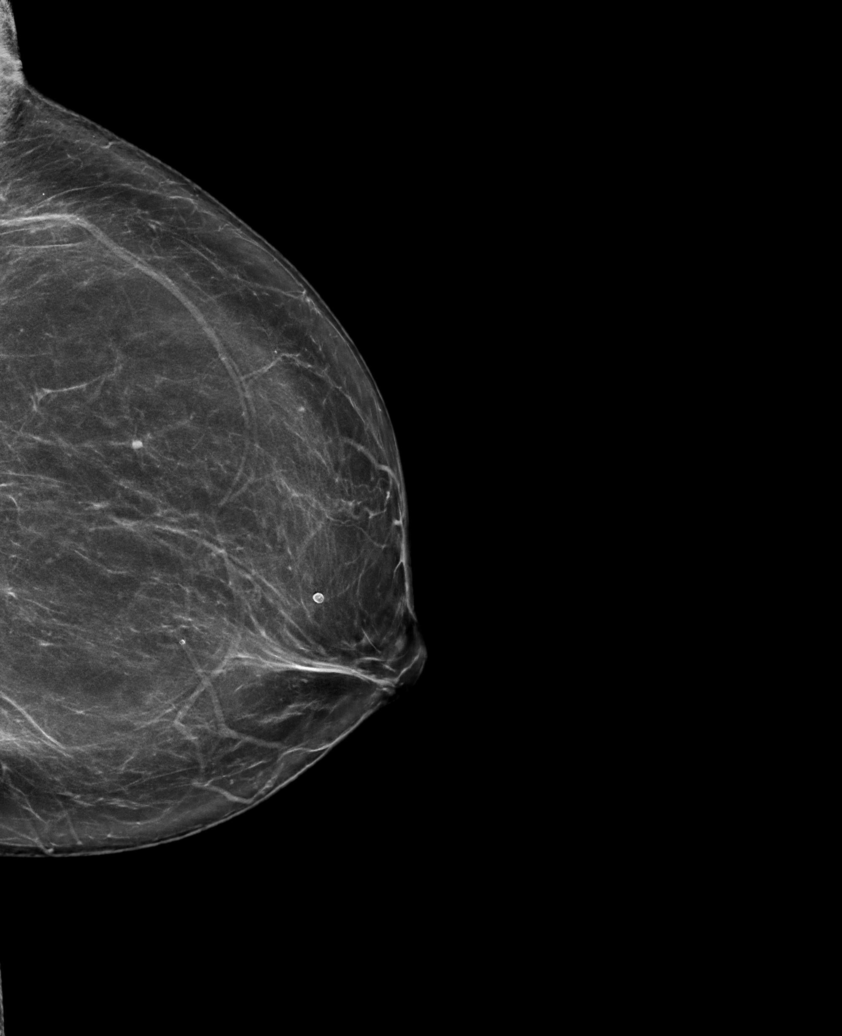

[L MLO tomo · tomo slice 37/74.0]
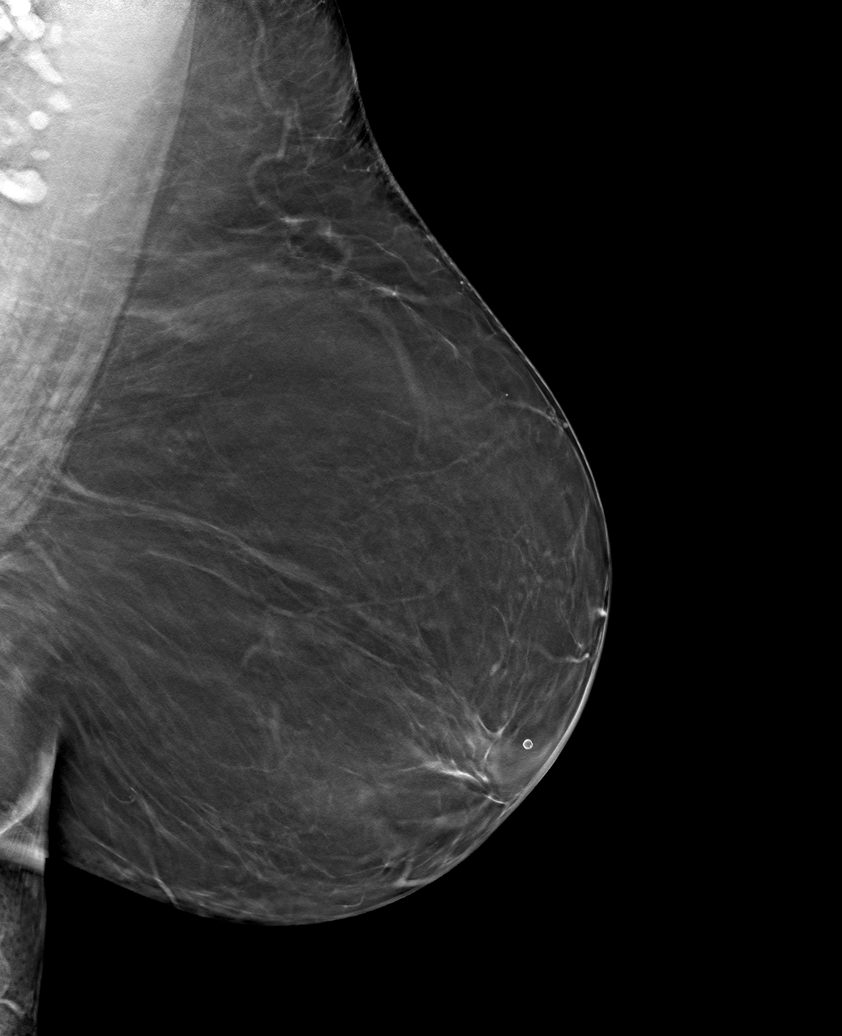

[6 of 30 positions shown; findings below may reference images not displayed]

ACR Breast Density Category b: There are scattered areas of
fibroglandular density.
FINDINGS: There are no findings suspicious for malignancy.
IMPRESSION: No mammographic evidence of malignancy. A result letter of this
screening mammogram will be mailed directly to the patient.

RECOMMENDATION:
Screening mammogram in one year. (Code:51-O-LD2)

BI-RADS CATEGORY  1: Negative.

## 2022-04-22 ENCOUNTER — Encounter: Payer: Medicare HMO | Admitting: Dermatology

## 2022-06-09 ENCOUNTER — Ambulatory Visit
Admission: RE | Admit: 2022-06-09 | Discharge: 2022-06-09 | Disposition: A | Discharge status: Home / Self Care | Payer: Medicare HMO | Source: Ambulatory Visit | Attending: Student | Admitting: Student

## 2022-06-09 DIAGNOSIS — Z1231 Encounter for screening mammogram for malignant neoplasm of breast: Secondary | ICD-10-CM | POA: Insufficient documentation

## 2023-03-19 ENCOUNTER — Other Ambulatory Visit (HOSPITAL_COMMUNITY): Payer: Self-pay | Admitting: Family Medicine

## 2023-03-19 ENCOUNTER — Other Ambulatory Visit: Payer: Self-pay | Admitting: Family Medicine

## 2023-03-19 DIAGNOSIS — Z780 Asymptomatic menopausal state: Secondary | ICD-10-CM

## 2023-03-19 DIAGNOSIS — Z1231 Encounter for screening mammogram for malignant neoplasm of breast: Secondary | ICD-10-CM

## 2023-03-25 ENCOUNTER — Ambulatory Visit (HOSPITAL_COMMUNITY)
Admission: RE | Admit: 2023-03-25 | Discharge: 2023-03-25 | Disposition: A | Discharge status: Home / Self Care | Payer: Medicare HMO | Source: Ambulatory Visit | Attending: Family Medicine | Admitting: Family Medicine

## 2023-03-25 DIAGNOSIS — Z780 Asymptomatic menopausal state: Secondary | ICD-10-CM | POA: Insufficient documentation

## 2023-06-11 ENCOUNTER — Ambulatory Visit
Admission: RE | Admit: 2023-06-11 | Discharge: 2023-06-11 | Disposition: A | Discharge status: Home / Self Care | Payer: Medicare HMO | Source: Ambulatory Visit | Attending: Family Medicine | Admitting: Family Medicine

## 2023-06-11 DIAGNOSIS — Z1231 Encounter for screening mammogram for malignant neoplasm of breast: Secondary | ICD-10-CM | POA: Insufficient documentation

## 2023-10-21 ENCOUNTER — Encounter: Payer: Self-pay | Admitting: Ophthalmology

## 2023-10-21 NOTE — Anesthesia Preprocedure Evaluation (Addendum)
Anesthesia Evaluation  Patient identified by MRN, date of birth, ID band Patient awake    Reviewed: Allergy & Precautions, H&P , NPO status , Patient's Chart, lab work & pertinent test results  Airway Mallampati: III  TM Distance: >3 FB Neck ROM: Full    Dental no notable dental hx. (+) Missing Left her partials at home:   Pulmonary neg pulmonary ROS   Pulmonary exam normal breath sounds clear to auscultation       Cardiovascular hypertension, negative cardio ROS Normal cardiovascular exam Rhythm:Regular Rate:Normal     Neuro/Psych negative neurological ROS  negative psych ROS   GI/Hepatic negative GI ROS, Neg liver ROS,,,  Endo/Other  negative endocrine ROS    Renal/GU negative Renal ROS  negative genitourinary   Musculoskeletal negative musculoskeletal ROS (+)    Abdominal   Peds negative pediatric ROS (+)  Hematology negative hematology ROS (+)   Anesthesia Other Findings Hx colonoscopy 01-27-22   Medical History  Bradycardia  Hypertension Valvular heart disease --noted on Epic, but I don't see an echo, nor a note on this. Perhaps I have overlooked it, but I can't find it.  Hyperlipidemia Wears partial  Very anxious     Reproductive/Obstetrics negative OB ROS                              Anesthesia Physical Anesthesia Plan  ASA: 3  Anesthesia Plan: MAC   Post-op Pain Management:    Induction: Intravenous  PONV Risk Score and Plan:   Airway Management Planned: Natural Airway and Nasal Cannula  Additional Equipment:   Intra-op Plan:   Post-operative Plan:   Informed Consent: I have reviewed the patients History and Physical, chart, labs and discussed the procedure including the risks, benefits and alternatives for the proposed anesthesia with the patient or authorized representative who has indicated his/her understanding and acceptance.     Dental Advisory  Given  Plan Discussed with: Anesthesiologist, CRNA and Surgeon  Anesthesia Plan Comments: (Patient consented for risks of anesthesia including but not limited to:  - adverse reactions to medications - damage to eyes, teeth, lips or other oral mucosa - nerve damage due to positioning  - sore throat or hoarseness - Damage to heart, brain, nerves, lungs, other parts of body or loss of life  Patient voiced understanding and assent.)         Anesthesia Quick Evaluation

## 2023-10-22 NOTE — Discharge Instructions (Signed)

## 2023-10-27 ENCOUNTER — Ambulatory Visit
Admission: RE | Admit: 2023-10-27 | Discharge: 2023-10-27 | Disposition: A | Discharge status: Home / Self Care | Payer: Medicare HMO | Attending: Ophthalmology | Admitting: Ophthalmology

## 2023-10-27 ENCOUNTER — Ambulatory Visit: Payer: Medicare HMO | Admitting: Anesthesiology

## 2023-10-27 ENCOUNTER — Other Ambulatory Visit: Payer: Self-pay

## 2023-10-27 ENCOUNTER — Encounter
Admission: RE | Disposition: A | Discharge status: Home / Self Care | Payer: Self-pay | Source: Home / Self Care | Attending: Ophthalmology

## 2023-10-27 ENCOUNTER — Encounter: Payer: Self-pay | Admitting: Ophthalmology

## 2023-10-27 DIAGNOSIS — I10 Essential (primary) hypertension: Secondary | ICD-10-CM | POA: Diagnosis not present

## 2023-10-27 DIAGNOSIS — H2511 Age-related nuclear cataract, right eye: Secondary | ICD-10-CM | POA: Insufficient documentation

## 2023-10-27 HISTORY — PX: CATARACT EXTRACTION W/PHACO: SHX586

## 2023-10-27 HISTORY — DX: Presence of dental prosthetic device (complete) (partial): Z97.2

## 2023-10-27 SURGERY — PHACOEMULSIFICATION, CATARACT, WITH IOL INSERTION
Anesthesia: Monitor Anesthesia Care | Laterality: Right

## 2023-10-27 MED ORDER — FENTANYL CITRATE (PF) 100 MCG/2ML IJ SOLN
INTRAMUSCULAR | Status: DC | PRN
  Administered 2023-10-27: 50 ug via INTRAVENOUS

## 2023-10-27 MED ORDER — BRIMONIDINE TARTRATE-TIMOLOL 0.2-0.5 % OP SOLN
OPHTHALMIC | Status: DC | PRN
  Administered 2023-10-27: 1 [drp] via OPHTHALMIC

## 2023-10-27 MED ORDER — MOXIFLOXACIN HCL 0.5 % OP SOLN
OPHTHALMIC | Status: DC | PRN
  Administered 2023-10-27: .2 mL via OPHTHALMIC

## 2023-10-27 MED ORDER — MIDAZOLAM HCL 2 MG/2ML IJ SOLN
INTRAMUSCULAR | Status: AC
Start: 2023-10-27 — End: ?
  Filled 2023-10-27: qty 2

## 2023-10-27 MED ORDER — ARMC OPHTHALMIC DILATING DROPS
1.0000 | OPHTHALMIC | Status: DC | PRN
Start: 2023-10-27 — End: 2023-10-27
  Administered 2023-10-27 (×3): 1 via OPHTHALMIC

## 2023-10-27 MED ORDER — FENTANYL CITRATE (PF) 100 MCG/2ML IJ SOLN
INTRAMUSCULAR | Status: AC
  Filled 2023-10-27: qty 2

## 2023-10-27 MED ORDER — TETRACAINE HCL 0.5 % OP SOLN
OPHTHALMIC | Status: AC
  Filled 2023-10-27: qty 4

## 2023-10-27 MED ORDER — SIGHTPATH DOSE#1 BSS IO SOLN
INTRAOCULAR | Status: DC | PRN
  Administered 2023-10-27: 2 mL

## 2023-10-27 MED ORDER — SIGHTPATH DOSE#1 BSS IO SOLN
INTRAOCULAR | Status: DC | PRN
  Administered 2023-10-27: 15 mL via INTRAOCULAR

## 2023-10-27 MED ORDER — TETRACAINE HCL 0.5 % OP SOLN
1.0000 [drp] | OPHTHALMIC | Status: DC | PRN
  Administered 2023-10-27 (×3): 1 [drp] via OPHTHALMIC

## 2023-10-27 MED ORDER — MIDAZOLAM HCL 2 MG/2ML IJ SOLN
INTRAMUSCULAR | Status: DC | PRN
  Administered 2023-10-27 (×2): 1 mg via INTRAVENOUS

## 2023-10-27 MED ORDER — SIGHTPATH DOSE#1 NA CHONDROIT SULF-NA HYALURON 40-17 MG/ML IO SOLN
INTRAOCULAR | Status: DC | PRN
  Administered 2023-10-27: 1 mL via INTRAOCULAR

## 2023-10-27 MED ORDER — SIGHTPATH DOSE#1 BSS IO SOLN
INTRAOCULAR | Status: DC | PRN
  Administered 2023-10-27: 46 mL via OPHTHALMIC

## 2023-10-27 SURGICAL SUPPLY — 10 items
ANGLE REVERSE CUT SHRT 25GA (CUTTER) ×1
CATARACT SUITE SIGHTPATH (MISCELLANEOUS) ×1
CYSTOTOME ANGL RVRS SHRT 25G (CUTTER) ×1 IMPLANT
FEE CATARACT SUITE SIGHTPATH (MISCELLANEOUS) ×1 IMPLANT
GLOVE BIOGEL PI IND STRL 8 (GLOVE) ×1 IMPLANT
GLOVE SURG LX STRL 8.0 MICRO (GLOVE) ×1 IMPLANT
LENS IOL TECNIS EYHANCE 20.0 (Intraocular Lens) IMPLANT
NDL FILTER BLUNT 18X1 1/2 (NEEDLE) ×1 IMPLANT
NEEDLE FILTER BLUNT 18X1 1/2 (NEEDLE) ×1
SYR 3ML LL SCALE MARK (SYRINGE) ×1 IMPLANT

## 2023-10-27 NOTE — Anesthesia Postprocedure Evaluation (Signed)
Anesthesia Post Note  Patient: Brittany George  Procedure(s) Performed: CATARACT EXTRACTION PHACO AND INTRAOCULAR LENS PLACEMENT (IOC) RIGHT 7.16 00:43.9 (Right)  Patient location during evaluation: PACU Anesthesia Type: MAC Level of consciousness: awake and alert Pain management: pain level controlled Vital Signs Assessment: post-procedure vital signs reviewed and stable Respiratory status: spontaneous breathing, nonlabored ventilation, respiratory function stable and patient connected to nasal cannula oxygen Cardiovascular status: stable and blood pressure returned to baseline Postop Assessment: no apparent nausea or vomiting Anesthetic complications: no   No notable events documented.   Last Vitals:  Vitals:   10/27/23 0811 10/27/23 0900  BP: (!) 144/84 133/83  Pulse: 63 (!) 55  Resp: (!) 8 20  Temp: (!) 36.3 C   SpO2: 98% 100%    Last Pain:  Vitals:   10/27/23 0811  TempSrc: Temporal  PainSc: 0-No pain                 [place] Stell C Diany Formosa

## 2023-10-27 NOTE — Op Note (Signed)
PREOPERATIVE DIAGNOSIS:  Nuclear sclerotic cataract of the right eye.   POSTOPERATIVE DIAGNOSIS:  Cataract   OPERATIVE PROCEDURE:ORPROCALL@   SURGEON:  Galen Manila, MD.   ANESTHESIA:  Anesthesiologist: Marisue Humble, MD CRNA: Barbette Hair, CRNA  1.      Managed anesthesia care. 2.      0.20ml of Shugarcaine was instilled in the eye following the paracentesis.   COMPLICATIONS:  None.   TECHNIQUE:   Stop and chop   DESCRIPTION OF PROCEDURE:  The patient was examined and consented in the preoperative holding area where the aforementioned topical anesthesia was applied to the right eye and then brought back to the Operating Room where the right eye was prepped and draped in the usual sterile ophthalmic fashion and a lid speculum was placed. A paracentesis was created with the side port blade and the anterior chamber was filled with viscoelastic. A near clear corneal incision was performed with the steel keratome. A continuous curvilinear capsulorrhexis was performed with a cystotome followed by the capsulorrhexis forceps. Hydrodissection and hydrodelineation were carried out with BSS on a blunt cannula. The lens was removed in a stop and chop  technique and the remaining cortical material was removed with the irrigation-aspiration handpiece. The capsular bag was inflated with viscoelastic and the Technis [id number]  lens was placed in the capsular bag without complication. The remaining viscoelastic was removed from the eye with the irrigation-aspiration handpiece. The wounds were hydrated. The anterior chamber was flushed with BSS and the eye was inflated to physiologic pressure. 0.23ml of Vigamox was placed in the anterior chamber. The wounds were found to be water tight. The eye was dressed with Combigan. The patient was given protective glasses to wear throughout the day and a shield with which to sleep tonight. The patient was also given drops with which to begin a drop regimen today and will  follow-up with me in one day. Implant Name Type Inv. Item Serial No. Manufacturer Lot No. LRB No. Used Action  LENS IOL TECNIS EYHANCE 20.0 - [id number] Intraocular Lens LENS IOL TECNIS EYHANCE 20.0 [id number] [employer]  Right 1 Implanted   Procedure(s): CATARACT EXTRACTION PHACO AND INTRAOCULAR LENS PLACEMENT (IOC) RIGHT 7.16 00:43.9 (Right)  Electronically signed: Galen Manila 10/27/2023 8:58 AM

## 2023-10-27 NOTE — H&P (Signed)
[facility]   Primary Care Physician:  [facility] Ophthalmologist: Dr.Chibueze Beasley  Pre-Procedure History & Physical: HPI:  Brittany George is a 72 y.o. female here for cataract surgery.   Past Medical History:  Diagnosis Date   Bradycardia    Hyperlipidemia    Hypertension    Valvular heart disease    Wears dentures    partial upper and lower    Past Surgical History:  Procedure Laterality Date   COLONOSCOPY WITH PROPOFOL N/A 09/13/2018   Procedure: COLONOSCOPY WITH PROPOFOL;  Surgeon: Christena Deem, MD;  Location: [facility] ENDOSCOPY;  Service: Endoscopy;  Laterality: N/A;   COLONOSCOPY WITH PROPOFOL N/A 01/27/2022   Procedure: COLONOSCOPY WITH PROPOFOL;  Surgeon: Regis Bill, MD;  Location: [facility] ENDOSCOPY;  Service: Endoscopy;  Laterality: N/A;   TUBAL LIGATION      Prior to Admission medications   Medication Sig Start Date End Date Taking? Authorizing Provider  acetaminophen (TYLENOL) 500 MG tablet Take 500 mg by mouth every 6 (six) hours as needed.   Yes [provider]  fluticasone (FLONASE) 50 MCG/ACT nasal spray Place 2 sprays into both nostrils daily.   Yes [provider]  loratadine (CLARITIN) 10 MG tablet Take 10 mg by mouth daily.   Yes [provider]  pravastatin (PRAVACHOL) 40 MG tablet Take 40 mg by mouth daily. 01/25/21  Yes [provider]  hydrocortisone 2.5 % cream Apply as needed for itch 1-2 times daily to area under breast Patient not taking: Reported on 10/21/2023 08/06/20   Willeen Niece, MD  ketoconazole (NIZORAL) 2 % cream Apply to areas under breast as needed for rash Patient not taking: Reported on 10/21/2023 08/06/20   Willeen Niece, MD  mometasone (ELOCON) 0.1 % cream SMARTSIG:Sparingly Topical Twice Daily Patient not taking: Reported on 10/21/2023 01/23/21   [provider]    Allergies as of 10/12/2023   (No Known Allergies)    Family History  Problem Relation Age of  Onset   Breast cancer Neg Hx     Social History   Socioeconomic History   Marital status: Married    Spouse name: Not on file   Number of children: Not on file   Years of education: Not on file   Highest education level: Not on file  Occupational History   Not on file  Tobacco Use   Smoking status: Never   Smokeless tobacco: Never  Vaping Use   Vaping status: Never Used  Substance and Sexual Activity   Alcohol use: Never   Drug use: Not on file   Sexual activity: Not on file  Other Topics Concern   Not on file  Social History Narrative   Not on file   Social Drivers of Health   Financial Resource Strain: Not on file  Food Insecurity: Not on file  Transportation Needs: Not on file  Physical Activity: Not on file  Stress: Not on file  Social Connections: Not on file  Intimate Partner Violence: Not on file    Review of Systems: See HPI, otherwise negative ROS  Physical Exam: BP (!) 144/84   Pulse 63   Temp (!) 97.4 F (36.3 C) (Temporal)   Resp (!) 8   Ht 5' 5.98" (1.676 m)   Wt 111 kg   SpO2 98%   BMI 39.51 kg/m  General:   Alert, cooperative in NAD Head:  Normocephalic and atraumatic. Respiratory:  Normal work of breathing. Cardiovascular:  RRR  Impression/Plan: Brittany Cloud  THAILA George is here for cataract surgery.  Risks, benefits, limitations, and alternatives regarding cataract surgery have been reviewed with the patient.  Questions have been answered.  All parties agreeable.   Galen Manila, MD  10/27/2023, 8:38 AM

## 2023-10-27 NOTE — Transfer of Care (Signed)
Immediate Anesthesia Transfer of Care Note  Patient: Brittany George  Procedure(s) Performed: CATARACT EXTRACTION PHACO AND INTRAOCULAR LENS PLACEMENT (IOC) RIGHT 7.16 00:43.9 (Right)  Patient Location: PACU  Anesthesia Type: MAC  Level of Consciousness: awake, alert  and patient cooperative  Airway and Oxygen Therapy: Patient Spontanous Breathing and Patient connected to supplemental oxygen  Post-op Assessment: Post-op Vital signs reviewed, Patient's Cardiovascular Status Stable, Respiratory Function Stable, Patent Airway and No signs of Nausea or vomiting  Post-op Vital Signs: Reviewed and stable  Complications: No notable events documented.

## 2023-10-28 ENCOUNTER — Encounter: Payer: Self-pay | Admitting: Ophthalmology

## 2024-06-10 ENCOUNTER — Other Ambulatory Visit: Payer: Self-pay | Admitting: Internal Medicine

## 2024-06-10 DIAGNOSIS — Z1231 Encounter for screening mammogram for malignant neoplasm of breast: Secondary | ICD-10-CM

## 2024-06-24 ENCOUNTER — Ambulatory Visit
Admission: RE | Admit: 2024-06-24 | Discharge: 2024-06-24 | Disposition: A | Discharge status: Home / Self Care | Source: Ambulatory Visit | Attending: Internal Medicine | Admitting: Internal Medicine

## 2024-06-24 DIAGNOSIS — Z1231 Encounter for screening mammogram for malignant neoplasm of breast: Secondary | ICD-10-CM | POA: Insufficient documentation
